# Patient Record
Sex: Male | Born: 1970 | Race: White | Hispanic: No | Marital: Single | Smoking: Former smoker
Health system: Southern US, Community
[De-identification: ages and names within clinical notes are randomized; demographics above are authoritative.]

## PROBLEM LIST (undated history)

## (undated) DIAGNOSIS — E7800 Pure hypercholesterolemia, unspecified: Secondary | ICD-10-CM

## (undated) DIAGNOSIS — E119 Type 2 diabetes mellitus without complications: Secondary | ICD-10-CM

## (undated) DIAGNOSIS — S82842A Displaced bimalleolar fracture of left lower leg, initial encounter for closed fracture: Secondary | ICD-10-CM

## (undated) DIAGNOSIS — J189 Pneumonia, unspecified organism: Secondary | ICD-10-CM

## (undated) DIAGNOSIS — I10 Essential (primary) hypertension: Secondary | ICD-10-CM

## (undated) HISTORY — PX: WISDOM TOOTH EXTRACTION: SHX21

---

## 2016-02-02 ENCOUNTER — Emergency Department (HOSPITAL_COMMUNITY): Payer: Worker's Compensation

## 2016-02-02 ENCOUNTER — Emergency Department (HOSPITAL_COMMUNITY)
Admission: EM | Admit: 2016-02-02 | Discharge: 2016-02-02 | Disposition: A | Discharge status: Home / Self Care | Payer: Worker's Compensation | Attending: Emergency Medicine | Admitting: Emergency Medicine

## 2016-02-02 ENCOUNTER — Encounter (HOSPITAL_COMMUNITY): Payer: Self-pay

## 2016-02-02 DIAGNOSIS — W1839XA Other fall on same level, initial encounter: Secondary | ICD-10-CM | POA: Insufficient documentation

## 2016-02-02 DIAGNOSIS — Z7984 Long term (current) use of oral hypoglycemic drugs: Secondary | ICD-10-CM | POA: Insufficient documentation

## 2016-02-02 DIAGNOSIS — Y999 Unspecified external cause status: Secondary | ICD-10-CM | POA: Diagnosis not present

## 2016-02-02 DIAGNOSIS — Z79899 Other long term (current) drug therapy: Secondary | ICD-10-CM | POA: Insufficient documentation

## 2016-02-02 DIAGNOSIS — E1165 Type 2 diabetes mellitus with hyperglycemia: Secondary | ICD-10-CM | POA: Insufficient documentation

## 2016-02-02 DIAGNOSIS — S82892A Other fracture of left lower leg, initial encounter for closed fracture: Secondary | ICD-10-CM

## 2016-02-02 DIAGNOSIS — Y929 Unspecified place or not applicable: Secondary | ICD-10-CM | POA: Insufficient documentation

## 2016-02-02 DIAGNOSIS — Z7982 Long term (current) use of aspirin: Secondary | ICD-10-CM | POA: Insufficient documentation

## 2016-02-02 DIAGNOSIS — S82852A Displaced trimalleolar fracture of left lower leg, initial encounter for closed fracture: Secondary | ICD-10-CM | POA: Insufficient documentation

## 2016-02-02 DIAGNOSIS — I10 Essential (primary) hypertension: Secondary | ICD-10-CM | POA: Diagnosis not present

## 2016-02-02 DIAGNOSIS — S9305XA Dislocation of left ankle joint, initial encounter: Secondary | ICD-10-CM | POA: Insufficient documentation

## 2016-02-02 DIAGNOSIS — S99912A Unspecified injury of left ankle, initial encounter: Secondary | ICD-10-CM | POA: Diagnosis present

## 2016-02-02 DIAGNOSIS — Y939 Activity, unspecified: Secondary | ICD-10-CM | POA: Diagnosis not present

## 2016-02-02 HISTORY — DX: Type 2 diabetes mellitus without complications: E11.9

## 2016-02-02 HISTORY — DX: Essential (primary) hypertension: I10

## 2016-02-02 HISTORY — DX: Pure hypercholesterolemia, unspecified: E78.00

## 2016-02-02 LAB — CBC WITH DIFFERENTIAL/PLATELET
BASOS ABS: 0 10*3/uL (ref 0.0–0.1)
BASOS PCT: 0 %
EOS ABS: 0.1 10*3/uL (ref 0.0–0.7)
Eosinophils Relative: 1 %
HEMATOCRIT: 44.3 % (ref 39.0–52.0)
HEMOGLOBIN: 14.7 g/dL (ref 13.0–17.0)
Lymphocytes Relative: 12 %
Lymphs Abs: 1.5 10*3/uL (ref 0.7–4.0)
MCH: 28.3 pg (ref 26.0–34.0)
MCHC: 33.2 g/dL (ref 30.0–36.0)
MCV: 85.4 fL (ref 78.0–100.0)
MONO ABS: 0.5 10*3/uL (ref 0.1–1.0)
Monocytes Relative: 4 %
NEUTROS ABS: 10.3 10*3/uL — AB (ref 1.7–7.7)
NEUTROS PCT: 83 %
Platelets: 285 10*3/uL (ref 150–400)
RBC: 5.19 MIL/uL (ref 4.22–5.81)
RDW: 12.6 % (ref 11.5–15.5)
WBC: 12.4 10*3/uL — ABNORMAL HIGH (ref 4.0–10.5)

## 2016-02-02 LAB — BASIC METABOLIC PANEL
ANION GAP: 9 (ref 5–15)
BUN: 14 mg/dL (ref 6–20)
CALCIUM: 10 mg/dL (ref 8.9–10.3)
CO2: 23 mmol/L (ref 22–32)
CREATININE: 1.11 mg/dL (ref 0.61–1.24)
Chloride: 103 mmol/L (ref 101–111)
Glucose, Bld: 172 mg/dL — ABNORMAL HIGH (ref 65–99)
Potassium: 4.3 mmol/L (ref 3.5–5.1)
SODIUM: 135 mmol/L (ref 135–145)

## 2016-02-02 MED ORDER — MIDAZOLAM HCL 2 MG/2ML IJ SOLN
INTRAMUSCULAR | Status: AC
  Filled 2016-02-02: qty 2

## 2016-02-02 MED ORDER — NAPROXEN 500 MG PO TABS: 500.0000 mg | ORAL_TABLET | Freq: Two times a day (BID) | ORAL | Status: AC | PRN

## 2016-02-02 MED ORDER — ETOMIDATE 2 MG/ML IV SOLN
10.0000 mg | Freq: Once | INTRAVENOUS | Status: DC
  Filled 2016-02-02: qty 10

## 2016-02-02 MED ORDER — HYDROMORPHONE HCL 1 MG/ML IJ SOLN
0.5000 mg | Freq: Once | INTRAMUSCULAR | Status: AC
  Administered 2016-02-02: 0.5 mg via INTRAVENOUS
  Filled 2016-02-02: qty 1

## 2016-02-02 MED ORDER — OXYCODONE-ACETAMINOPHEN 5-325 MG PO TABS: 1.0000 | ORAL_TABLET | Freq: Four times a day (QID) | ORAL | Status: AC | PRN

## 2016-02-02 MED ORDER — MIDAZOLAM HCL 2 MG/2ML IJ SOLN
INTRAMUSCULAR | Status: AC | PRN
  Administered 2016-02-02: 2 mg via INTRAVENOUS

## 2016-02-02 MED ORDER — MIDAZOLAM HCL 2 MG/2ML IJ SOLN: 2.0000 mg | Freq: Once | INTRAMUSCULAR | Status: DC

## 2016-02-02 MED ORDER — HYDROMORPHONE HCL 1 MG/ML IJ SOLN
0.5000 mg | Freq: Once | INTRAMUSCULAR | Status: AC
Start: 2016-02-02 — End: 2016-02-02
  Administered 2016-02-02: 0.5 mg via INTRAVENOUS
  Filled 2016-02-02: qty 1

## 2016-02-02 MED ORDER — ETOMIDATE 2 MG/ML IV SOLN
INTRAVENOUS | Status: AC | PRN
  Administered 2016-02-02: 10 mg via INTRAVENOUS

## 2016-02-02 NOTE — ED Notes (Addendum)
Pt. BIB [facility] following L lower leg injury. Pt. [facility] got stuck. Pt. Has deformity to L ankle and lower leg. Pt. Leg splinted on arrival. Pt. Given 10 mg morphine and 4 mg zofran.

## 2016-02-02 NOTE — Progress Notes (Signed)
Orthopedic Tech Progress Note Patient Details:  Lonia ChimeraMichael W Zalesky 1971/01/14 [id number]  Ortho Devices Type of Ortho Device: Ace wrap, Post (short leg) splint, Stirrup splint, Crutches Ortho Device/Splint Location: lle Ortho Device/Splint Interventions: Application   Jullian Previti 02/02/2016, 2:47 PM

## 2016-02-02 NOTE — ED Provider Notes (Signed)
CSN: [id number]     Arrival date & time 02/02/16  1100 History   First MD Initiated Contact with Patient 02/02/16 1100     Chief Complaint  Patient presents with  . Leg Injury     (Consider location/radiation/quality/duration/timing/severity/associated sxs/prior Treatment) HPI  Past Medical History  Diagnosis Date  . Hypertension   . Diabetes mellitus without complication (HCC)   . Hypercholesteremia    History reviewed. No pertinent past surgical history. No family history on file. Social History  Substance Use Topics  . Smoking status: None  . Smokeless tobacco: None  . Alcohol Use: None    Review of Systems    Allergies  Review of patient's allergies indicates no known allergies.  Home Medications   Prior to Admission medications   Medication Sig Start Date End Date Taking? Authorizing Provider  aspirin 81 MG tablet Take 81 mg by mouth daily.   Yes Historical Provider, MD  fenofibrate (TRICOR) 145 MG tablet Take 145 mg by mouth daily. 01/09/16  Yes Historical Provider, MD  ibuprofen (ADVIL,MOTRIN) 200 MG tablet Take 200 mg by mouth every 6 (six) hours as needed for fever.   Yes Historical Provider, MD  JARDIANCE 10 MG TABS tablet Take 10 mg by mouth daily. 01/26/16  Yes Historical Provider, MD  losartan (COZAAR) 25 MG tablet Take 25 mg by mouth daily. 01/13/16  Yes Historical Provider, MD  metFORMIN (GLUCOPHAGE) 500 MG tablet Take 1,000 mg by mouth 2 (two) times daily. 01/09/16  Yes Historical Provider, MD  simvastatin (ZOCOR) 40 MG tablet Take 40 mg by mouth daily. 01/09/16  Yes Historical Provider, MD  naproxen (NAPROSYN) 500 MG tablet Take 1 tablet (500 mg total) by mouth 2 (two) times daily as needed for mild pain or moderate pain (TAKE WITH MEALS.). 02/02/16   Mercedes Camprubi-Soms, PA-C  oxyCODONE-acetaminophen (PERCOCET) 5-325 MG tablet Take 1-2 tablets by mouth every 6 (six) hours as needed for severe pain. 02/02/16   Mercedes Camprubi-Soms, PA-C   BP 123/85 mmHg   Pulse 90  Temp(Src) 99 F (37.2 C) (Oral)  Resp 10  Ht 5\' 6"  (1.676 m)  SpO2 89% Physical Exam  ED Course  .Sedation Date/Time: 02/02/2016 2:22 PM Performed by: Jacalyn Lefevre Authorized by: Jacalyn Lefevre  Consent:    Consent obtained:  Written   Consent given by:  Patient and spouse   Risks discussed:  Allergic reaction, dysrhythmia, inadequate sedation, nausea, prolonged hypoxia resulting in organ damage, prolonged sedation necessitating reversal, respiratory compromise necessitating ventilatory assistance and intubation and vomiting   Alternatives discussed:  Analgesia without sedation Indications:    Sedation purpose:  Fracture reduction   Procedure necessitating sedation performed by:  Physician performing sedation   Intended level of sedation:  Moderate (conscious sedation) Pre-sedation assessment:    Time since last food or drink:  HOURS   ASA classification: class 1 - normal, healthy patient     Neck mobility: normal     Mouth opening:  3 or more finger widths   Pre-sedation assessments completed and reviewed: airway patency, cardiovascular function, hydration status, mental status, nausea/vomiting, respiratory function and temperature   Immediate pre-procedure details:    Reassessment: Patient reassessed immediately prior to procedure     Reviewed: vital signs, relevant labs/tests and NPO status     Verified: bag valve mask available, emergency equipment available, intubation equipment available, IV patency confirmed, oxygen available, reversal medications available and suction available   Procedure details (see MAR for exact dosages):  Preoxygenation:  Nasal cannula   Sedation:  Etomidate and midazolam   Analgesia:  Hydromorphone   Intra-procedure monitoring:  Blood pressure monitoring, cardiac monitor, continuous capnometry, continuous pulse oximetry, frequent LOC assessments and frequent vital sign checks   Intra-procedure events: hypoxia     Intra-procedure  management:  Airway repositioning Post-procedure details:    Attendance: Constant attendance by certified staff until patient recovered     Recovery: Patient returned to pre-procedure baseline     Post-sedation assessments completed and reviewed: airway patency, cardiovascular function, hydration status, mental status, nausea/vomiting, pain level, respiratory function and temperature     Patient is stable for discharge or admission: Yes     Patient tolerance:  Tolerated well, no immediate complications Reduction of fracture Date/Time: 02/02/2016 2:30 PM Performed by: Jacalyn LefevreHAVILAND, Aerion Bagdasarian Authorized by: Jacalyn LefevreHAVILAND, Shadae Reino Consent: Written consent obtained. Risks and benefits: risks, benefits and alternatives were discussed Consent given by: patient Patient understanding: patient states understanding of the procedure being performed Patient consent: the patient's understanding of the procedure matches consent given Procedure consent: procedure consent matches procedure scheduled Relevant documents: relevant documents present and verified Test results: test results available and properly labeled Site marked: the operative site was marked Imaging studies: imaging studies available Required items: required blood products, implants, devices, and special equipment available Patient identity confirmed: verbally with patient Time out: Immediately prior to procedure a "time out" was called to verify the correct patient, procedure, equipment, support staff and site/side marked as required. Preparation: Patient was prepped and draped in the usual sterile fashion. Local anesthesia used: no Patient sedated: yes Sedation type: moderate (conscious) sedation Sedatives: etomidate and midazolam Vitals: Vital signs were monitored during sedation. Patient tolerance: Patient tolerated the procedure well with no immediate complications   (including critical care time) Labs Review Labs Reviewed  CBC WITH  DIFFERENTIAL/PLATELET - Abnormal; Notable for the following:    WBC 12.4 (*)    Neutro Abs 10.3 (*)    All other components within normal limits  BASIC METABOLIC PANEL - Abnormal; Notable for the following:    Glucose, Bld 172 (*)    All other components within normal limits    Imaging Review Dg Tibia/fibula Left  02/02/2016  CLINICAL DATA:  Left ankle deformity after a fall during training. EXAM: LEFT TIBIA AND FIBULA - 2 VIEW COMPARISON:  None. FINDINGS: Today' s exam excludes the ankle which is radiographic independently. No knee effusion. Very early degenerative marginal spurring of the patella with low-level prepatellar edema and subtle edema in Kager's fat pad. No fracture of the proximal or mid portions of the tibia or fibula identified. The patient has known ankle fractures detailed under separate report. IMPRESSION: 1. No additional fractures of the tibia or fibula, aside from that is in the vicinity of the ankle. 2. Trace edema in Kager's fat pad with subtle prepatellar edema. Electronically Signed   By: Gaylyn RongWalter  Liebkemann M.D.   On: 02/02/2016 12:20   Dg Ankle 2 Views Left  02/02/2016  CLINICAL DATA:  Status post external reduction and splinting, fracture dislocation of the left ankle. EXAM: LEFT ANKLE - 2 VIEW COMPARISON:  02/02/2016 at 11:48 a.m. FINDINGS: Interval reduction of the prior dislocation. Fiberglass splint reduces bony detail. Oblique lateral malleolar fracture and oblique posterior malleolar fracture are again observed although low reduced. There is an unusual dorsal prominence medially at the Lisfranc joint. This may simply be projectional but I would suggest obtaining dedicated foot projections if possible to ensure lack of injury at  the Lisfranc joint. IMPRESSION: 1. Excellent reduction of the prior ankle dislocation status post external reduction and splinting. Oblique lateral malleolar and posterior malleolar fractures observed, much better approximated than before. 2.  Questionable dorsal bony irregularity medially at the Lisfranc joint. This is unusual enough appearing that I would suggest dedicated foot radiographs to assess the Lisfranc joint. Electronically Signed   By: Gaylyn Rong M.D.   On: 02/02/2016 13:41   Dg Ankle Complete Left  02/02/2016  CLINICAL DATA:  Left ankle deformity after slipping on a mat during a training exercise with [employer]. EXAM: LEFT ANKLE COMPLETE - 3+ VIEW COMPARISON:  None. FINDINGS: Prominently displaced oblique fracture of the lateral malleolus. The distal tibia is displaced about 3.5 cm head medially, as is the shaft of the distal fibula, and both appear rotated with respect to the talus. There is a bony fragment just proximal to the talus probably representing a posterior malleolar fragment. Classic appearance of posterior malleolar fracture is difficult to ascertain on the lateral projection due to the degree of rotation of the tibia. The abnormal appearance on today' s exam implies a tear of the tibiotalar portion of the deltoid ligament. Because of orientation on the lateral projection, the subtalar joint and calcaneocuboid joint are difficult to assess. IMPRESSION: 1. Trimalleolar fracture dislocation, including oblique lateral malleolar fracture, posterior malleolar fracture, and with prominent medial dislocation of the distal tibia and the fibular shaft fracture component with respect to the talus implying a concomitant tear of the deltoid ligament. In addition there is a somewhat unusual degree of rotation of the distal tibia and fibular shaft with respect to the talus, with the medial malleolus rotated to a posterior position with respect to the talus. Electronically Signed   By: Gaylyn Rong M.D.   On: 02/02/2016 12:18   I have personally reviewed and evaluated these images and lab results as part of my medical decision-making.   EKG Interpretation None      MDM   Final diagnoses:  Closed left  ankle fracture, initial encounter  Ankle dislocation, left, initial encounter  Type 2 diabetes mellitus with hyperglycemia, without long-term current use of insulin (HCC)  Essential hypertension      Jacalyn Lefevre, MD 02/02/16 1458

## 2016-02-02 NOTE — Discharge Instructions (Signed)
Wear ankle splint at all times. Use crutches for all weight bearing activities. Use ice and elevate ankle throughout the day, keeping it propped up above the level of your heart at all times aside from when you're eating or using the restroom, and use ice pack for 20 minutes every hour. Alternate between naprosyn and percocet for pain relief. Do not drive or operate machinery with pain medication use. Call orthopedic surgeon's office (Dr. Roda ShuttersXu) today  to schedule followup appointment tomorrow for ongoing management of your ankle fracture. Dr. Roda ShuttersXu was called today and knows you will be contacting his office for further management of your ankle fracture. Return to the ER for changes or worsening symptoms.    Tibial and Fibular Fracture, Adult Tibial and fibular fracture is a break in the bones of your lower leg (tibia and fibula). The tibia is the larger of these two bones. The fibula is the smaller of the two bones. It is on the outer side of your leg.  CAUSES  Low-energy injuries, such as a fall from ground level.  High-energy injuries, such as motor vehicle injuries, gunshot wounds, or high-speed sports collisions. RISK FACTORS  Jumping activities.  Repetitive stress, such as long-distance running.  Participation in sports.  Osteoporosis.  Advanced age. SIGNS AND SYMPTOMS  Pain.  Swelling.  Inability to put weight on your injured leg.  Bone deformities at the site of your injury.  Bruising. DIAGNOSIS  Tibial and fibular fractures are diagnosed with the use of X-ray exams. TREATMENT  If you have a simple fracture of these two bones, they can be treated with simple immobilization. A cast or splint will be used on your leg to keep it from moving while it heals. Then you can begin range-of-motion exercises to regain your knee motion. HOME CARE INSTRUCTIONS   Apply ice to your leg:  Put ice in a plastic bag.  Place a towel between your skin and the bag.  Leave the ice on for 20  minutes, 2-3 times a day.  If you have a plaster or fiberglass cast:  Do not try to scratch the skin under the cast using sharp or pointed objects.  Check the skin around the cast every day. You may put lotion on any red or sore areas.  Keep your cast dry and clean.  If you have a plaster splint:  Wear the splint as directed.  You may loosen the elastic around the splint if your toes become numb, tingle, or turn cold or blue.  Do not put pressure on any part of your cast or splint until it is fully hardened, because it may deform.  Your cast or splint can be protected during bathing with a plastic bag. Do not lower the cast or splint into water.  Use crutches as directed.  Only take over-the-counter or prescription medicines for pain, discomfort, or fever as directed by your health care provider.  Follow all instructions given to you by your health care provider.  Make and keep all follow-up appointments. SEEK MEDICAL CARE IF:  Your pain is becoming worse rather than better or is not controlled with medicines.  You have increased swelling or redness in the foot.  You begin to lose feeling in your foot or toes. SEEK IMMEDIATE MEDICAL CARE IF:  You develop a cold or blue foot or toes on the injured side.  You develop severe pain in your injured leg, especially if the pain is increased with movement of your toes. MAKE SURE  YOU:  Understand these instructions.  Will watch your condition.  Will get help right away if you are not doing well or get worse.   This information is not intended to replace advice given to you by your health care provider. Make sure you discuss any questions you have with your health care provider.   Document Released: 04/24/2002 Document Revised: 12/17/2014 Document Reviewed: 03/14/2013 Elsevier Interactive Patient Education 2016 [employer]  Cast or Splint Care Casts and splints support injured limbs and keep bones from moving while they  heal.  HOME CARE  Keep the cast or splint uncovered during the drying period.  A plaster cast can take 24 to 48 hours to dry.  A fiberglass cast will dry in less than 1 hour.  Do not rest the cast on anything harder than a pillow for 24 hours.  Do not put weight on your injured limb. Do not put pressure on the cast. Wait for your doctor's approval.  Keep the cast or splint dry.  Cover the cast or splint with a plastic bag during baths or wet weather.  If you have a cast over your chest and belly (trunk), take sponge baths until the cast is taken off.  If your cast gets wet, dry it with a towel or blow dryer. Use the cool setting on the blow dryer.  Keep your cast or splint clean. Wash a dirty cast with a damp cloth.  Do not put any objects under your cast or splint.  Do not scratch the skin under the cast with an object. If itching is a problem, use a blow dryer on a cool setting over the itchy area.  Do not trim or cut your cast.  Do not take out the padding from inside your cast.  Exercise your joints near the cast as told by your doctor.  Raise (elevate) your injured limb on 1 or 2 pillows for the first 1 to 3 days. GET HELP IF:  Your cast or splint cracks.  Your cast or splint is too tight or too loose.  You itch badly under the cast.  Your cast gets wet or has a soft spot.  You have a bad smell coming from the cast.  You get an object stuck under the cast.  Your skin around the cast becomes red or sore.  You have new or more pain after the cast is put on. GET HELP RIGHT AWAY IF:  You have fluid leaking through the cast.  You cannot move your fingers or toes.  Your fingers or toes turn blue or white or are cool, painful, or puffy (swollen).  You have tingling or lose feeling (numbness) around the injured area.  You have bad pain or pressure under the cast.  You have trouble breathing or have shortness of breath.  You have chest pain.   This  information is not intended to replace advice given to you by your health care provider. Make sure you discuss any questions you have with your health care provider.   Document Released: 12/02/2010 Document Revised: 04/04/2013 Document Reviewed: 02/08/2013 Elsevier Interactive Patient Education 2016 [employer]  Ankle Dislocation Ankle dislocation happens when the ankle bones move out of place. Usually, the injury that causes ankle dislocation also causes other injuries that are more serious. Often these injuries are broken ankle bones. You also may have injured nerves and blood vessels.  Your doctor will put your ankle back in place. Any tears on your skin around  your ankle will be closed (this is usually done in surgery). A cast or splint will be placed around your ankle to hold it in place while it heals. Sometimes, screws and plates need to be drilled into your ankle bones to hold your ankle in place. Sometimes, pins are drilled into the bones of your lower leg and foot. These pins attach to a metal bar outside your body. The pins and the bar hold your bones in place until surgery can be done. HOME CARE  Rest your injured joint. Do not move it.  Put ice on your injured joint for 1 to 2 days or as told by your doctor.  Put ice in a plastic bag.  Place a towel between your skin and the bag.  Leave the ice on for 15 to 20 minutes, every 2 hours while your are awake.  Raise your ankle above your heart as told by your doctor. This helps limit puffiness.  Move your toes as told by your doctor so they do not get stiff.  Only take medicines as told by your doctor. GET HELP RIGHT AWAY IF:  Your cast or splint becomes loose or damaged.  Your screws, plates, or the metal bar outside your body becomes loose or damaged.  You notice fluid draining around any pins.  Your pain becomes worse, not better.  You lose feeling in your toe or cannot bend the tip of your toe. MAKE SURE YOU:    Understand these instructions.  Will watch your condition.  Will get help right away if you are not doing well or get worse.   This information is not intended to replace advice given to you by your health care provider. Make sure you discuss any questions you have with your health care provider.   Document Released: 03/31/2011 Document Revised: 08/23/2014 Document Reviewed: 03/04/2015 Elsevier Interactive Patient Education [employer]

## 2016-02-02 NOTE — ED Provider Notes (Signed)
CSN: [id number]     Arrival date & time 02/02/16  1100 History   First MD Initiated Contact with Patient 02/02/16 1100     Chief Complaint  Patient presents with  . Leg Injury     (Consider location/radiation/quality/duration/timing/severity/associated sxs/prior Treatment) HPI Comments: HADYN BLANCK is a 45 y.o. male with a PMHx of HTN, HLD, DM2, and chronic back pain, who presents to the ED with complaints of left ankle injury prior to arrival. Patient states that he was in self-defense training, had his left foot planted and he got stuck underneath the mat as his body was pulled down and twisted sideways during a training maneuver. This occurred around 9-10 AM approximately 1 hour prior to arrival. He has an obvious deformity to the left ankle with decreased range of motion in the left ankle, and complains of 5/10 constant throbbing/aching nonradiating left ankle pain, worse with movement, and improved with 10 mg of morphine and immobilization performed by EMS. He last ate around 8 AM when he had a yogurt, fruits medley, and egg sandwich. He has no known allergies to any medications. He has not previously injured this ankle, nor does he have a orthopedic surgeon. He previously had back pain for which she has been seen by the spine specialists across the [place] [place], cannot recall their name, but denies that this was an [employer], operational.  He denies any head injury, loss of consciousness, neck or back pain, knee or thigh pain, other injuries, skin injury to the left ankle, bruising, swelling, chest pain, shortness breath, abdominal pain, nausea, vomiting, urinary symptoms, numbness, tingling, or focal weakness. He is able to wiggle his toes.  Patient is a 45 y.o. male presenting with ankle pain. The history is provided by the patient and the EMS personnel. No language interpreter was used.  Ankle Pain Location:  Ankle Time since incident:  1 hour Injury: yes   Mechanism of injury: fall    Mechanism of injury comment:  L foot planted on mat, body taken down during self-defence training Ankle location:  L ankle Pain details:    Quality:  Aching and throbbing   Radiates to:  Does not radiate   Severity:  Moderate   Onset quality:  Sudden   Duration:  1 hour   Timing:  Constant   Progression:  Unchanged Chronicity:  New Dislocation: yes   Foreign body present:  No foreign bodies Prior injury to area:  No Relieved by:  Immobilization (and morphine 10mg ) Worsened by:  Activity Ineffective treatments:  None tried Associated symptoms: decreased ROM   Associated symptoms: no back pain, no muscle weakness, no neck pain, no numbness, no swelling and no tingling     Past Medical History  Diagnosis Date  . Hypertension   . Diabetes mellitus without complication (HCC)   . Hypercholesteremia    History reviewed. No pertinent past surgical history. No family history on file. Social History  Substance Use Topics  . Smoking status: None  . Smokeless tobacco: None  . Alcohol Use: None    Review of Systems  HENT: Negative for facial swelling (no head inj).   Respiratory: Negative for shortness of breath.   Cardiovascular: Negative for chest pain.  Gastrointestinal: Negative for nausea, vomiting and abdominal pain.  Genitourinary: Negative for dysuria and hematuria.  Musculoskeletal: Positive for arthralgias. Negative for myalgias, back pain, joint swelling and neck pain.  Skin: Negative for color change and wound.  Allergic/Immunologic: Positive for immunocompromised state (diabetic).  Neurological:  Negative for syncope, weakness and numbness.  Psychiatric/Behavioral: Negative for confusion.   10 Systems reviewed and are negative for acute change except as noted in the HPI.    Allergies  Review of patient's allergies indicates not on file.  Home Medications   Prior to Admission medications   Medication Sig Start Date End Date Taking? Authorizing Provider   aspirin 81 MG tablet Take 81 mg by mouth daily.   Yes Historical Provider, MD  fenofibrate (TRICOR) 145 MG tablet Take 145 mg by mouth daily. 01/09/16  Yes Historical Provider, MD  ibuprofen (ADVIL,MOTRIN) 200 MG tablet Take 200 mg by mouth every 6 (six) hours as needed for fever.   Yes Historical Provider, MD  JARDIANCE 10 MG TABS tablet Take 10 mg by mouth daily. 01/26/16  Yes Historical Provider, MD  losartan (COZAAR) 25 MG tablet Take 25 mg by mouth daily. 01/13/16  Yes Historical Provider, MD  metFORMIN (GLUCOPHAGE) 500 MG tablet Take 1,000 mg by mouth 2 (two) times daily. 01/09/16  Yes Historical Provider, MD  simvastatin (ZOCOR) 40 MG tablet Take 40 mg by mouth daily. 01/09/16  Yes Historical Provider, MD   BP 159/124 mmHg  Pulse 77  Temp(Src) 99 F (37.2 C) (Oral)  Resp 22  SpO2 100% Recheck VS: BP 129/95 mmHg  Pulse 75  Temp(Src) 99 F (37.2 C) (Oral)  Resp 20  SpO2 98%  Physical Exam  Constitutional: He is oriented to person, place, and time. Vital signs are normal. He appears well-developed and well-nourished.  Non-toxic appearance. No distress.  Afebrile, nontoxic, NAD, HTN noted which is likely due to pain/stress response  HENT:  Head: Normocephalic and atraumatic.  Mouth/Throat: Oropharynx is clear and moist and mucous membranes are normal.  Eyes: Conjunctivae and EOM are normal. Right eye exhibits no discharge. Left eye exhibits no discharge.  Neck: Normal range of motion. Neck supple.  Cardiovascular: Normal rate, regular rhythm, normal heart sounds and intact distal pulses.  Exam reveals no gallop and no friction rub.   No murmur heard. Pulmonary/Chest: Effort normal and breath sounds normal. No respiratory distress. He has no decreased breath sounds. He has no wheezes. He has no rhonchi. He has no rales.  Abdominal: Soft. Normal appearance and bowel sounds are normal. He exhibits no distension. There is no tenderness. There is no rigidity, no rebound and no guarding.   Musculoskeletal:       Left knee: He exhibits normal range of motion, no swelling, no deformity, normal alignment, no LCL laxity and no MCL laxity. No tenderness found.       Left ankle: He exhibits decreased range of motion (in splint) and deformity. He exhibits no swelling, no ecchymosis, no laceration and normal pulse. Tenderness.  L knee without TTP or bruising/swelling, no deformity, FROM intact.  LLE splinted. No tib/fib tenderness or bruising/abrasions. L ankle with obvious deformity, L foot externally rotated/deviated with skin tenting medially, focally tender over the L ankle deformity, no other tenderness to calf or foot, wiggles all digits with ease, distal pulses and cap refill brisk and present, sensation grossly intact in all extremities. No bruising or swelling noted, skin intact. Achilles not able to be assessed due to splint on leg.  All other extremities without evidence of injury, no focal areas of tenderness to remaining extremities or back.  Neurological: He is alert and oriented to person, place, and time. He has normal strength. No sensory deficit.  Skin: Skin is warm, dry and intact. No rash noted.  Psychiatric: He has a normal mood and affect.  Nursing note and vitals reviewed.   ED Course  Procedures (including critical care time)  SPLINT APPLICATION Date/Time: 1:23 PM Authorized by: Ramond Marrow Consent: Verbal consent obtained. Risks and benefits: risks, benefits and alternatives were discussed Consent given by: patient Splint applied by: orthopedic technician and myself Location details: L ankle Splint type: posterior short leg and sugar tong Supplies used: orthoglass Post-procedure: The splinted body part was neurovascularly unchanged following the procedure. Patient tolerance: Patient tolerated the procedure well with no immediate complications.    Labs Review Labs Reviewed  CBC WITH DIFFERENTIAL/PLATELET - Abnormal; Notable for the  following:    WBC 12.4 (*)    Neutro Abs 10.3 (*)    All other components within normal limits  BASIC METABOLIC PANEL - Abnormal; Notable for the following:    Glucose, Bld 172 (*)    All other components within normal limits    Imaging Review Dg Tibia/fibula Left  02/02/2016  CLINICAL DATA:  Left ankle deformity after a fall during training. EXAM: LEFT TIBIA AND FIBULA - 2 VIEW COMPARISON:  None. FINDINGS: Today' s exam excludes the ankle which is radiographic independently. No knee effusion. Very early degenerative marginal spurring of the patella with low-level prepatellar edema and subtle edema in Kager's fat pad. No fracture of the proximal or mid portions of the tibia or fibula identified. The patient has known ankle fractures detailed under separate report. IMPRESSION: 1. No additional fractures of the tibia or fibula, aside from that is in the vicinity of the ankle. 2. Trace edema in Kager's fat pad with subtle prepatellar edema. Electronically Signed   By: Gaylyn Rong M.D.   On: 02/02/2016 12:20   Dg Ankle 2 Views Left  02/02/2016  CLINICAL DATA:  Status post external reduction and splinting, fracture dislocation of the left ankle. EXAM: LEFT ANKLE - 2 VIEW COMPARISON:  02/02/2016 at 11:48 a.m. FINDINGS: Interval reduction of the prior dislocation. Fiberglass splint reduces bony detail. Oblique lateral malleolar fracture and oblique posterior malleolar fracture are again observed although low reduced. There is an unusual dorsal prominence medially at the Lisfranc joint. This may simply be projectional but I would suggest obtaining dedicated foot projections if possible to ensure lack of injury at the Lisfranc joint. IMPRESSION: 1. Excellent reduction of the prior ankle dislocation status post external reduction and splinting. Oblique lateral malleolar and posterior malleolar fractures observed, much better approximated than before. 2. Questionable dorsal bony irregularity medially at  the Lisfranc joint. This is unusual enough appearing that I would suggest dedicated foot radiographs to assess the Lisfranc joint. Electronically Signed   By: Gaylyn Rong M.D.   On: 02/02/2016 13:41   Dg Ankle Complete Left  02/02/2016  CLINICAL DATA:  Left ankle deformity after slipping on a mat during a training exercise with the [employer]. EXAM: LEFT ANKLE COMPLETE - 3+ VIEW COMPARISON:  None. FINDINGS: Prominently displaced oblique fracture of the lateral malleolus. The distal tibia is displaced about 3.5 cm head medially, as is the shaft of the distal fibula, and both appear rotated with respect to the talus. There is a bony fragment just proximal to the talus probably representing a posterior malleolar fragment. Classic appearance of posterior malleolar fracture is difficult to ascertain on the lateral projection due to the degree of rotation of the tibia. The abnormal appearance on today' s exam implies a tear of the tibiotalar portion of the deltoid ligament. Because of orientation  on the lateral projection, the subtalar joint and calcaneocuboid joint are difficult to assess. IMPRESSION: 1. Trimalleolar fracture dislocation, including oblique lateral malleolar fracture, posterior malleolar fracture, and with prominent medial dislocation of the distal tibia and the fibular shaft fracture component with respect to the talus implying a concomitant tear of the deltoid ligament. In addition there is a somewhat unusual degree of rotation of the distal tibia and fibular shaft with respect to the talus, with the medial malleolus rotated to a posterior position with respect to the talus. Electronically Signed   By: Gaylyn RongWalter  Liebkemann M.D.   On: 02/02/2016 12:18   I have personally reviewed and evaluated these images and lab results as part of my medical decision-making.   EKG Interpretation None      MDM   Final diagnoses:  Closed left ankle fracture, initial encounter  Ankle  dislocation, left, initial encounter  Type 2 diabetes mellitus with hyperglycemia, without long-term current use of insulin (HCC)  Essential hypertension    45 y.o. male here with L ankle pain/deformity after injury during which his foot was planted and his body was taken down to the ground (self defense class). +Pulses intact, wiggles digits without difficulty, obvious deformity with foot rotated externally, tenderness to ankle joint region, no tib/fib or foot tenderness, no bruising noted. Sensation grossly intact. Cap refill brisk and present. Pt got morphine en route, declines any further medications at this time, advised him to let us know if he changes his mind on this. No ortho surgeon previously. NPO since 8am. HTN noted, but likely related to pain/stress response. No other injuries noted, no other evidence of trauma to remainder of body. Will obtain xray of L tib/fib/ankle. Will get basic labs in case pt needs operative management of this injury. Will reassess shortly. Discussed case with my attending Dr. Particia NearingHaviland who agrees with plan.   11:33 AM Dr. Particia NearingHaviland in to see pt, he is now requesting pain meds, will give Dilaudid now. Also wants procedural sedation orders to be started so we can realign the ankle, she will order the medications, will get nursing staff on board. Awaiting labs/imaging. Will reassess shortly.   12:30 PM CBC w/diff with mild leukocytosis likely from stress demargination; otherwise WNL. BMP with mildly elevated gluc 172, otherwise WNL. Xray showing trimalleolar fx with dislocation including oblique lateral malleolar fx and posterior malleolar fx with prominent medial dislocation of the distal tibia and fibular shaft fx component with respect to talus, likely concomitant deltoid tear. Tib/fib xray neg. Will proceed with reduction with procedural sedation done by Dr. Particia NearingHaviland. Pt updated and in agreement with plan. Still in pain, will give more dilaudid now. Will get ortho  tech with splinting supplies ready at bed side. Discussed that he'll likely need surgical intervention at a later date to fix the fractures, but could likely schedule this as an outpatient as long as we are able to improve alignment here today. Will reassess after reduction  1:21 PM Procedure went well, pt tolerating well, reduction of ankle dislocation performed by Dr. Particia NearingHaviland with my assistance in splinting, ankle with improved alignment visually, will get post-reduction xrays done now. Will touch base with ortho to discuss case so he can f/up closely with them for likely surgical intervention of this injury. Pt stable at this time  1:33 PM  Post-reduction films seen while they were being taken, alignment improved, dislocation appears reduced, awaiting official reading. Discussed case with ortho, Dr. Roda ShuttersXu requests that we obtain CT ankle  fx today and d/c home with instructions to keep ankle elevated and f/up in the office tomorrow, call for appt. Will obtain CT and d/c as soon as this is performed. Pt declines any concerns at this time, denies needing more pain medication. Will await CT, then d/c home with rx for percocet/naprosyn, crutches given. RICE discussed. F/up with ortho for ongoing management of fx.   1:51 PM Post-reduction xray with excellent reduction, better approximation of fractures. There is a dorsal irregularity medially at the Lisfranc joint, recommending dedicated foot imaging. Discussed with radiology tech, asked that they please include this area on the CT of his ankle that way we can avoid needing further imaging aside from the CT. They will include this in the CT-- awaiting this to be done, then can get him discharged. Pt stable  2:48 PM Pt with slightly increased pain during ambulation trial with crutches, will give one last dose of pain meds prior to d/c. Has appt with Dr. Roda Shutters tomorrow. D/c home with previously discussed plan. I explained the diagnosis and have given explicit  precautions to return to the ER including for any other new or worsening symptoms. The patient understands and accepts the medical plan as it's been dictated and I have answered their questions. Discharge instructions concerning home care and prescriptions have been given. The patient is STABLE and is discharged to home in good condition.   BP 113/73 mmHg  Pulse 76  Temp(Src) 99 F (37.2 C) (Oral)  Resp 12  Ht 5\' 6"  (1.676 m)  SpO2 98%  Meds ordered this encounter  Medications  . HYDROmorphone (DILAUDID) injection 0.5 mg    Sig:   . etomidate (AMIDATE) injection 10 mg    Sig:   . midazolam (VERSED) injection 2 mg    Sig:   . midazolam (VERSED) 2 MG/2ML injection    Sig:     Grace Bushy   : cabinet override  . HYDROmorphone (DILAUDID) injection 0.5 mg    Sig:   . oxyCODONE-acetaminophen (PERCOCET) 5-325 MG tablet    Sig: Take 1-2 tablets by mouth every 6 (six) hours as needed for severe pain.    Dispense:  30 tablet    Refill:  0    Order Specific Question:  Supervising Provider    Answer:  MILLER, BRIAN [[id number]]  . naproxen (NAPROSYN) 500 MG tablet    Sig: Take 1 tablet (500 mg total) by mouth 2 (two) times daily as needed for mild pain or moderate pain (TAKE WITH MEALS.).    Dispense:  60 tablet    Refill:  0    Order Specific Question:  Supervising Provider    Answer:  MILLER, BRIAN [[id number]]  . etomidate (AMIDATE) injection    Sig:   . midazolam (VERSED) injection    Sig:   . HYDROmorphone (DILAUDID) injection 0.5 mg    Sig:      Karelyn Brisby Camprubi-Soms, PA-C 02/02/16 1449  Jacalyn Lefevre, MD 02/02/16 1504

## 2016-02-03 ENCOUNTER — Encounter (HOSPITAL_BASED_OUTPATIENT_CLINIC_OR_DEPARTMENT_OTHER): Payer: Self-pay | Admitting: *Deleted

## 2016-02-03 ENCOUNTER — Other Ambulatory Visit: Payer: Self-pay | Admitting: Orthopaedic Surgery

## 2016-02-04 ENCOUNTER — Ambulatory Visit (HOSPITAL_BASED_OUTPATIENT_CLINIC_OR_DEPARTMENT_OTHER)
Admission: RE | Admit: 2016-02-04 | Discharge: 2016-02-04 | Disposition: A | Discharge status: Home / Self Care | Payer: Worker's Compensation | Source: Ambulatory Visit | Attending: Orthopaedic Surgery | Admitting: Orthopaedic Surgery

## 2016-02-04 ENCOUNTER — Encounter (HOSPITAL_BASED_OUTPATIENT_CLINIC_OR_DEPARTMENT_OTHER)
Admission: RE | Disposition: A | Discharge status: Home / Self Care | Payer: Self-pay | Source: Ambulatory Visit | Attending: Orthopaedic Surgery

## 2016-02-04 ENCOUNTER — Ambulatory Visit (HOSPITAL_BASED_OUTPATIENT_CLINIC_OR_DEPARTMENT_OTHER): Payer: Worker's Compensation | Admitting: Anesthesiology

## 2016-02-04 ENCOUNTER — Encounter (HOSPITAL_BASED_OUTPATIENT_CLINIC_OR_DEPARTMENT_OTHER): Payer: Self-pay | Admitting: Anesthesiology

## 2016-02-04 ENCOUNTER — Ambulatory Visit (HOSPITAL_COMMUNITY): Payer: Worker's Compensation

## 2016-02-04 DIAGNOSIS — E119 Type 2 diabetes mellitus without complications: Secondary | ICD-10-CM | POA: Diagnosis not present

## 2016-02-04 DIAGNOSIS — Z79899 Other long term (current) drug therapy: Secondary | ICD-10-CM | POA: Insufficient documentation

## 2016-02-04 DIAGNOSIS — E7800 Pure hypercholesterolemia, unspecified: Secondary | ICD-10-CM | POA: Diagnosis not present

## 2016-02-04 DIAGNOSIS — I10 Essential (primary) hypertension: Secondary | ICD-10-CM | POA: Insufficient documentation

## 2016-02-04 DIAGNOSIS — S82842A Displaced bimalleolar fracture of left lower leg, initial encounter for closed fracture: Secondary | ICD-10-CM | POA: Insufficient documentation

## 2016-02-04 DIAGNOSIS — X58XXXA Exposure to other specified factors, initial encounter: Secondary | ICD-10-CM | POA: Insufficient documentation

## 2016-02-04 DIAGNOSIS — Z87891 Personal history of nicotine dependence: Secondary | ICD-10-CM | POA: Insufficient documentation

## 2016-02-04 DIAGNOSIS — Z7984 Long term (current) use of oral hypoglycemic drugs: Secondary | ICD-10-CM | POA: Diagnosis not present

## 2016-02-04 DIAGNOSIS — T148XXA Other injury of unspecified body region, initial encounter: Secondary | ICD-10-CM

## 2016-02-04 HISTORY — DX: Displaced bimalleolar fracture of left lower leg, initial encounter for closed fracture: S82.842A

## 2016-02-04 HISTORY — PX: ORIF ANKLE FRACTURE: SHX5408

## 2016-02-04 HISTORY — DX: Pneumonia, unspecified organism: J18.9

## 2016-02-04 LAB — GLUCOSE, CAPILLARY
Glucose-Capillary: 179 mg/dL — ABNORMAL HIGH (ref 65–99)
Glucose-Capillary: 196 mg/dL — ABNORMAL HIGH (ref 65–99)

## 2016-02-04 SURGERY — OPEN REDUCTION INTERNAL FIXATION (ORIF) ANKLE FRACTURE
Anesthesia: General | Site: Ankle | Laterality: Left

## 2016-02-04 MED ORDER — GLYCOPYRROLATE 0.2 MG/ML IJ SOLN: 0.2000 mg | Freq: Once | INTRAMUSCULAR | Status: DC | PRN

## 2016-02-04 MED ORDER — MIDAZOLAM HCL 2 MG/2ML IJ SOLN
INTRAMUSCULAR | Status: AC
  Filled 2016-02-04: qty 2

## 2016-02-04 MED ORDER — SENNOSIDES-DOCUSATE SODIUM 8.6-50 MG PO TABS: 1.0000 | ORAL_TABLET | Freq: Every evening | ORAL | Status: AC | PRN

## 2016-02-04 MED ORDER — MIDAZOLAM HCL 5 MG/5ML IJ SOLN
INTRAMUSCULAR | Status: DC | PRN
  Administered 2016-02-04: 2 mg via INTRAVENOUS

## 2016-02-04 MED ORDER — LACTATED RINGERS IV SOLN
INTRAVENOUS | Status: DC
  Administered 2016-02-04 (×2): via INTRAVENOUS

## 2016-02-04 MED ORDER — METHOCARBAMOL 750 MG PO TABS: 750.0000 mg | ORAL_TABLET | Freq: Two times a day (BID) | ORAL | Status: AC | PRN

## 2016-02-04 MED ORDER — SCOPOLAMINE 1 MG/3DAYS TD PT72
1.0000 | MEDICATED_PATCH | Freq: Once | TRANSDERMAL | Status: DC | PRN
Start: 2016-02-04 — End: 2016-02-04

## 2016-02-04 MED ORDER — ONDANSETRON HCL 4 MG/2ML IJ SOLN
INTRAMUSCULAR | Status: DC | PRN
  Administered 2016-02-04: 4 mg via INTRAVENOUS

## 2016-02-04 MED ORDER — PROPOFOL 10 MG/ML IV BOLUS
INTRAVENOUS | Status: DC | PRN
  Administered 2016-02-04: 300 mg via INTRAVENOUS

## 2016-02-04 MED ORDER — OXYCODONE HCL 5 MG/5ML PO SOLN: 5.0000 mg | Freq: Once | ORAL | Status: DC | PRN

## 2016-02-04 MED ORDER — HYDROMORPHONE HCL 1 MG/ML IJ SOLN
INTRAMUSCULAR | Status: AC
  Filled 2016-02-04: qty 1

## 2016-02-04 MED ORDER — ONDANSETRON HCL 4 MG PO TABS: 4.0000 mg | ORAL_TABLET | Freq: Three times a day (TID) | ORAL | Status: AC | PRN

## 2016-02-04 MED ORDER — CEFAZOLIN SODIUM-DEXTROSE 2-4 GM/100ML-% IV SOLN
INTRAVENOUS | Status: AC
  Filled 2016-02-04: qty 100

## 2016-02-04 MED ORDER — DEXAMETHASONE SODIUM PHOSPHATE 4 MG/ML IJ SOLN
INTRAMUSCULAR | Status: DC | PRN
  Administered 2016-02-04: 10 mg via INTRAVENOUS

## 2016-02-04 MED ORDER — KETOROLAC TROMETHAMINE 30 MG/ML IJ SOLN
INTRAMUSCULAR | Status: DC | PRN
  Administered 2016-02-04: 30 mg via INTRAVENOUS

## 2016-02-04 MED ORDER — FENTANYL CITRATE (PF) 100 MCG/2ML IJ SOLN
INTRAMUSCULAR | Status: AC
  Filled 2016-02-04: qty 2

## 2016-02-04 MED ORDER — LIDOCAINE HCL (CARDIAC) 20 MG/ML IV SOLN
INTRAVENOUS | Status: DC | PRN
  Administered 2016-02-04: 30 mg via INTRAVENOUS

## 2016-02-04 MED ORDER — FENTANYL CITRATE (PF) 100 MCG/2ML IJ SOLN
INTRAMUSCULAR | Status: DC | PRN
  Administered 2016-02-04 (×2): 100 ug via INTRAVENOUS

## 2016-02-04 MED ORDER — FENTANYL CITRATE (PF) 100 MCG/2ML IJ SOLN
50.0000 ug | INTRAMUSCULAR | Status: DC | PRN
  Administered 2016-02-04: 100 ug via INTRAVENOUS

## 2016-02-04 MED ORDER — OXYCODONE HCL 5 MG PO TABS: 5.0000 mg | ORAL_TABLET | Freq: Once | ORAL | Status: DC | PRN

## 2016-02-04 MED ORDER — MEPERIDINE HCL 25 MG/ML IJ SOLN: 6.2500 mg | INTRAMUSCULAR | Status: DC | PRN

## 2016-02-04 MED ORDER — ROPIVACAINE HCL 5 MG/ML IJ SOLN
INTRAMUSCULAR | Status: DC | PRN
  Administered 2016-02-04 (×2): 10 mL via PERINEURAL

## 2016-02-04 MED ORDER — HYDROMORPHONE HCL 1 MG/ML IJ SOLN
0.2500 mg | INTRAMUSCULAR | Status: DC | PRN
  Administered 2016-02-04 (×4): 0.5 mg via INTRAVENOUS

## 2016-02-04 MED ORDER — OXYCODONE HCL 5 MG PO TABS: 5.0000 mg | ORAL_TABLET | ORAL | Status: AC | PRN

## 2016-02-04 MED ORDER — DEXAMETHASONE SODIUM PHOSPHATE 10 MG/ML IJ SOLN
INTRAMUSCULAR | Status: AC
  Filled 2016-02-04: qty 1

## 2016-02-04 MED ORDER — PROPOFOL 500 MG/50ML IV EMUL
INTRAVENOUS | Status: AC
  Filled 2016-02-04: qty 50

## 2016-02-04 MED ORDER — BUPIVACAINE-EPINEPHRINE (PF) 0.5% -1:200000 IJ SOLN
INTRAMUSCULAR | Status: DC | PRN
  Administered 2016-02-04: 10 mL via PERINEURAL
  Administered 2016-02-04: 10 mL

## 2016-02-04 MED ORDER — ASPIRIN EC 325 MG PO TBEC: 325.0000 mg | DELAYED_RELEASE_TABLET | Freq: Two times a day (BID) | ORAL | Status: AC

## 2016-02-04 MED ORDER — KETOROLAC TROMETHAMINE 30 MG/ML IJ SOLN
INTRAMUSCULAR | Status: AC
  Filled 2016-02-04: qty 1

## 2016-02-04 MED ORDER — CEFAZOLIN SODIUM-DEXTROSE 2-4 GM/100ML-% IV SOLN
2.0000 g | INTRAVENOUS | Status: AC
Start: 2016-02-04 — End: 2016-02-04
  Administered 2016-02-04: 2 g via INTRAVENOUS

## 2016-02-04 MED ORDER — SODIUM CHLORIDE 0.9 % IR SOLN
Status: DC | PRN
  Administered 2016-02-04: 3000 mL

## 2016-02-04 MED ORDER — ONDANSETRON HCL 4 MG/2ML IJ SOLN
INTRAMUSCULAR | Status: AC
  Filled 2016-02-04: qty 2

## 2016-02-04 MED ORDER — OXYCODONE HCL ER 10 MG PO T12A: 10.0000 mg | EXTENDED_RELEASE_TABLET | Freq: Two times a day (BID) | ORAL | Status: AC

## 2016-02-04 MED ORDER — MIDAZOLAM HCL 2 MG/2ML IJ SOLN
1.0000 mg | INTRAMUSCULAR | Status: DC | PRN
  Administered 2016-02-04: 2 mg via INTRAVENOUS

## 2016-02-04 MED ORDER — LIDOCAINE 2% (20 MG/ML) 5 ML SYRINGE
INTRAMUSCULAR | Status: AC
  Filled 2016-02-04: qty 5

## 2016-02-04 SURGICAL SUPPLY — 76 items
BANDAGE ACE 4X5 VEL STRL LF (GAUZE/BANDAGES/DRESSINGS) IMPLANT
BANDAGE ACE 6X5 VEL STRL LF (GAUZE/BANDAGES/DRESSINGS) ×3 IMPLANT
BANDAGE ESMARK 6X9 LF (GAUZE/BANDAGES/DRESSINGS) ×1 IMPLANT
BIT DRILL 3.5X122MM AO FIT (BIT) ×3 IMPLANT
BLADE HEX COATED 2.75 (ELECTRODE) ×3 IMPLANT
BLADE SURG 15 STRL LF DISP TIS (BLADE) ×2 IMPLANT
BLADE SURG 15 STRL SS (BLADE) ×4
BNDG COHESIVE 6X5 TAN STRL LF (GAUZE/BANDAGES/DRESSINGS) ×3 IMPLANT
BNDG ESMARK 6X9 LF (GAUZE/BANDAGES/DRESSINGS) ×3
CANISTER SUCT 1200ML W/VALVE (MISCELLANEOUS) ×3 IMPLANT
COVER BACK TABLE 60X90IN (DRAPES) ×3 IMPLANT
CUFF TOURNIQUET SINGLE 34IN LL (TOURNIQUET CUFF) IMPLANT
DECANTER SPIKE VIAL GLASS SM (MISCELLANEOUS) IMPLANT
DRAPE C-ARM 42X72 X-RAY (DRAPES) ×3 IMPLANT
DRAPE C-ARMOR (DRAPES) ×3 IMPLANT
DRAPE EXTREMITY T 121X128X90 (DRAPE) ×3 IMPLANT
DRAPE IMP U-DRAPE 54X76 (DRAPES) ×3 IMPLANT
DRAPE SURG 17X23 STRL (DRAPES) ×6 IMPLANT
DRILL 2.6X122MM WL AO SHAFT (BIT) ×3 IMPLANT
DRSG PAD ABDOMINAL 8X10 ST (GAUZE/BANDAGES/DRESSINGS) ×6 IMPLANT
DURAPREP 26ML APPLICATOR (WOUND CARE) ×6 IMPLANT
ELECT REM PT RETURN 9FT ADLT (ELECTROSURGICAL) ×3
ELECTRODE REM PT RTRN 9FT ADLT (ELECTROSURGICAL) ×1 IMPLANT
GAUZE SPONGE 4X4 12PLY STRL (GAUZE/BANDAGES/DRESSINGS) ×3 IMPLANT
GAUZE XEROFORM 1X8 LF (GAUZE/BANDAGES/DRESSINGS) ×3 IMPLANT
GLOVE BIOGEL PI IND STRL 8 (GLOVE) ×1 IMPLANT
GLOVE BIOGEL PI INDICATOR 8 (GLOVE) ×2
GLOVE EXAM NITRILE MD LF STRL (GLOVE) ×3 IMPLANT
GLOVE SKINSENSE NS SZ7.5 (GLOVE) ×2
GLOVE SKINSENSE STRL SZ7.5 (GLOVE) ×1 IMPLANT
GLOVE SURG SS PI 8.0 STRL IVOR (GLOVE) ×3 IMPLANT
GLOVE SURG SYN 7.5  E (GLOVE) ×2
GLOVE SURG SYN 7.5 E (GLOVE) ×1 IMPLANT
GOWN STRL REIN XL XLG (GOWN DISPOSABLE) ×3 IMPLANT
GOWN STRL REUS W/ TWL LRG LVL3 (GOWN DISPOSABLE) ×1 IMPLANT
GOWN STRL REUS W/TWL LRG LVL3 (GOWN DISPOSABLE) ×2
K-WIRE ORTHOPEDIC 1.4X150L (WIRE) ×6
KWIRE ORTHOPEDIC 1.4X150L (WIRE) ×2 IMPLANT
NEEDLE HYPO 22GX1.5 SAFETY (NEEDLE) IMPLANT
NS IRRIG 1000ML POUR BTL (IV SOLUTION) ×3 IMPLANT
PACK BASIN DAY SURGERY FS (CUSTOM PROCEDURE TRAY) ×3 IMPLANT
PAD CAST 3X4 CTTN HI CHSV (CAST SUPPLIES) IMPLANT
PAD CAST 4YDX4 CTTN HI CHSV (CAST SUPPLIES) ×1 IMPLANT
PADDING CAST COTTON 3X4 STRL (CAST SUPPLIES)
PADDING CAST COTTON 4X4 STRL (CAST SUPPLIES) ×2
PADDING CAST COTTON 6X4 STRL (CAST SUPPLIES) ×3 IMPLANT
PADDING CAST SYN 6 (CAST SUPPLIES)
PADDING CAST SYNTHETIC 4 (CAST SUPPLIES)
PADDING CAST SYNTHETIC 4X4 STR (CAST SUPPLIES) IMPLANT
PADDING CAST SYNTHETIC 6X4 NS (CAST SUPPLIES) IMPLANT
PENCIL BUTTON HOLSTER BLD 10FT (ELECTRODE) ×3 IMPLANT
PLATE FIBULA 4H (Plate) ×3 IMPLANT
SCREW BONE 3.5X20MM (Screw) ×3 IMPLANT
SCREW BONE NON-LCKING 3.5X12MM (Screw) ×9 IMPLANT
SCREW LOCKING 3.5X16MM (Screw) ×3 IMPLANT
SCREW LOCKING 3.5X18MM (Screw) ×6 IMPLANT
SCREW NONLOCK 22MM (Screw) ×3 IMPLANT
SHEET MEDIUM DRAPE 40X70 STRL (DRAPES) ×3 IMPLANT
SLEEVE SCD COMPRESS KNEE MED (MISCELLANEOUS) ×3 IMPLANT
SPLINT FIBERGLASS 3X35 (CAST SUPPLIES) IMPLANT
SPLINT FIBERGLASS 4X30 (CAST SUPPLIES) IMPLANT
SPONGE LAP 18X18 X RAY DECT (DISPOSABLE) ×3 IMPLANT
SUCTION FRAZIER HANDLE 10FR (MISCELLANEOUS) ×2
SUCTION TUBE FRAZIER 10FR DISP (MISCELLANEOUS) ×1 IMPLANT
SUT ETHILON 3 0 PS 1 (SUTURE) ×3 IMPLANT
SUT VIC AB 0 CT1 27 (SUTURE) ×2
SUT VIC AB 0 CT1 27XBRD ANBCTR (SUTURE) ×1 IMPLANT
SUT VIC AB 2-0 CT1 27 (SUTURE) ×2
SUT VIC AB 2-0 CT1 TAPERPNT 27 (SUTURE) ×1 IMPLANT
SYR BULB 3OZ (MISCELLANEOUS) ×3 IMPLANT
SYR CONTROL 10ML LL (SYRINGE) IMPLANT
TOWEL OR 17X24 6PK STRL BLUE (TOWEL DISPOSABLE) ×3 IMPLANT
TUBE CONNECTING 20'X1/4 (TUBING) ×1
TUBE CONNECTING 20X1/4 (TUBING) ×2 IMPLANT
UNDERPAD 30X30 (UNDERPADS AND DIAPERS) ×3 IMPLANT
YANKAUER SUCT BULB TIP NO VENT (SUCTIONS) IMPLANT

## 2016-02-04 NOTE — Anesthesia Preprocedure Evaluation (Signed)
Anesthesia Evaluation  Patient identified by MRN, date of birth, ID band Patient awake    Reviewed: Allergy & Precautions, NPO status , Patient's Chart, lab work & pertinent test results  Airway Mallampati: I  TM Distance: >3 FB Neck ROM: Full    Dental  (+) Teeth Intact, Dental Advisory Given   Pulmonary former smoker,    breath sounds clear to auscultation       Cardiovascular hypertension, Pt. on medications  Rhythm:Regular Rate:Normal     Neuro/Psych    GI/Hepatic   Endo/Other  diabetes, Well Controlled, Type 2, Oral Hypoglycemic Agents  Renal/GU      Musculoskeletal   Abdominal   Peds  Hematology   Anesthesia Other Findings   Reproductive/Obstetrics                             Anesthesia Physical Anesthesia Plan  ASA: III  Anesthesia Plan: General   Post-op Pain Management: GA combined w/ Regional for post-op pain   Induction: Intravenous  Airway Management Planned: LMA  Additional Equipment:   Intra-op Plan:   Post-operative Plan: Extubation in OR  Informed Consent: I have reviewed the patients History and Physical, chart, labs and discussed the procedure including the risks, benefits and alternatives for the proposed anesthesia with the patient or authorized representative who has indicated his/her understanding and acceptance.   Dental advisory given  Plan Discussed with: CRNA, Anesthesiologist and Surgeon  Anesthesia Plan Comments:         Anesthesia Quick Evaluation

## 2016-02-04 NOTE — Transfer of Care (Signed)
Immediate Anesthesia Transfer of Care Note  Patient: Andre ChimeraMichael W Berrios  Procedure(s) Performed: Procedure(s): OPEN REDUCTION INTERNAL FIXATION (ORIF) LEFT BIMALLEOLAR ANKLE FRACTURE (Left)  Patient Location: PACU  Anesthesia Type:GA combined with regional for post-op pain  Level of Consciousness: sedated  Airway & Oxygen Therapy: Patient Spontanous Breathing and Patient connected to face mask oxygen  Post-op Assessment: Report given to RN and Post -op Vital signs reviewed and stable  Post vital signs: Reviewed and stable  Last Vitals:  Filed Vitals:   02/04/16 0955 02/04/16 1000  BP: 119/87 137/81  Pulse: 88 73  Temp:    Resp: 17 17    Last Pain:  Filed Vitals:   02/04/16 1001  PainSc: 2          Complications: No apparent anesthesia complications

## 2016-02-04 NOTE — Op Note (Addendum)
   Date of Surgery: 02/04/2016  INDICATIONS: Andre Watson is a 45 y.o.-year-old male who sustained a left ankle fracture; he was indicated for open reduction and internal fixation due to the displaced nature of the articular fracture and came to the operating room today for this procedure. The patient did consent to the procedure after discussion of the risks and benefits.  PREOPERATIVE DIAGNOSIS: left bimalleolar ankle fracture  POSTOPERATIVE DIAGNOSIS: Same.  PROCEDURE: Open treatment of left ankle fracture with internal fixation. Bimalleolar CPT [phone];   SURGEON: N. Glee ArvinMichael Chao Blazejewski, M.D.  ASSIST: none.  ANESTHESIA:  general, regional  TOURNIQUET TIME: see record  IV FLUIDS AND URINE: See anesthesia.  ESTIMATED BLOOD LOSS: minimal mL.  IMPLANTS: [employer]ker Variax 4 hole distal fibula plate  COMPLICATIONS: None.  DESCRIPTION OF PROCEDURE: The patient was brought to the operating room and placed supine on the operating table.  The patient had been signed prior to the procedure and this was documented. The patient had the anesthesia placed by the anesthesiologist.  A nonsterile tourniquet was placed on the upper thigh.  The prep verification and incision time-outs were performed to confirm that this was the correct patient, site, side and location. The patient had an SCD on the opposite lower extremity. The patient did receive antibiotics prior to the incision and was re-dosed during the procedure as needed at indicated intervals.  The patient had the lower extremity prepped and draped in the standard surgical fashion.  The extremity was exsanguinated using an esmarch bandage and the tourniquet was inflated to 300 mm Hg.  A lateral incision was created over distal fibula.  Full thickness flaps were elevated.  Fracture was exposed.  Organized hematoma was removed.  Fracture was reduced and held with a clamp.  An anterior to posterior lag screw was placed.  Fluoroscopy confirmed proper placement.  Clamp  was removed. Fracture stayed reduced.  An appropriately sized plate was placed on the lateral aspect of the fibula in neutralization mode.  Locking and unicortical locking screws were placed in the bone through the plate.  The posterior malleolus fracture was stable after fixation of the fibula.  Stress exam showed an intact syndesmosis.  Final xrays were taken.  Wound was thoroughly irrigated and closed with 0 vicryl, 2.0 vicryl, 3.0 nylon.  Sterile dressings were applied.  Short leg splint placed.  Patient tolerated procedure well.  POSTOPERATIVE PLAN: Andre Watson will remain nonweightbearing on this leg for approximately 6 weeks; Andre Watson will return for suture removal in 2 weeks.  He will be immobilized in a short leg splint and then transitioned to a CAM walker at his first follow up appointment.  Andre Watson will receive DVT prophylaxis based on other medications, activity level, and risk ratio of bleeding to thrombosis.  Mayra ReelN. Rashawd Milbern Doescher, MD [facility] [phone] 11:33 AM

## 2016-02-04 NOTE — Progress Notes (Signed)
Assisted Dr. Crews with left, ultrasound guided, popliteal/saphenous block. Side rails up, monitors on throughout procedure. See vital signs in flow sheet. Tolerated Procedure well. 

## 2016-02-04 NOTE — Anesthesia Procedure Notes (Addendum)
Anesthesia Regional Block:  Popliteal block  Pre-Anesthetic Checklist: ,, timeout performed, Correct Patient, Correct Site, Correct Laterality, Correct Procedure, Correct Position, site marked, Risks and benefits discussed,  Surgical consent,  Pre-op evaluation,  At surgeon's request and post-op pain management  Laterality: Left and Lower  Prep: chloraprep       Needles:  Injection technique: Single-shot  Needle Type: Echogenic Needle     Needle Length: 9cm 9 cm Needle Gauge: 21 and 21 G    Additional Needles:  Procedures: ultrasound guided (picture in chart) Popliteal block Narrative:  Start time: 02/04/2016 9:41 AM End time: 02/04/2016 9:45 AM Injection made incrementally with aspirations every 5 mL.  Performed by: Personally  Anesthesiologist: CREWS, DAVID   Anesthesia Regional Block:  Adductor canal block  Pre-Anesthetic Checklist: ,, timeout performed, Correct Patient, Correct Site, Correct Laterality, Correct Procedure, Correct Position, site marked, Risks and benefits discussed,  Surgical consent,  Pre-op evaluation,  At surgeon's request and post-op pain management  Laterality: Left and Lower  Prep: chloraprep       Needles:  Injection technique: Single-shot  Needle Type: Echogenic Needle     Needle Length: 9cm 9 cm Needle Gauge: 21 and 21 G    Additional Needles:  Procedures: ultrasound guided (picture in chart) Adductor canal block Narrative:  Start time: 02/04/2016 9:51 AM End time: 02/04/2016 9:55 AM Injection made incrementally with aspirations every 5 mL.  Performed by: Personally  Anesthesiologist: CREWS, DAVID   Procedure Name: LMA Insertion Date/Time: 02/04/2016 10:13 AM Performed by: Chandler DesanctisLINKA, Boneta Standre L Pre-anesthesia Checklist: Patient identified, Emergency Drugs available, Suction available and Patient being monitored Patient Re-evaluated:Patient Re-evaluated prior to inductionOxygen Delivery Method: Circle system  utilized Preoxygenation: Pre-oxygenation with 100% oxygen Intubation Type: IV induction Ventilation: Mask ventilation without difficulty LMA: LMA inserted LMA Size: 5.0 Number of attempts: 1 Airway Equipment and Method: Bite block Placement Confirmation: positive ETCO2 Tube secured with: Tape Dental Injury: Teeth and Oropharynx as per pre-operative assessment       Left Popliteal block image    Left saphenous block image

## 2016-02-04 NOTE — H&P (Signed)
    PREOPERATIVE H&P  Chief Complaint: left bimalleolar ankle fracture  HPI: Andre Watson is a 45 y.o. male who presents for surgical treatment of left bimalleolar ankle fracture.  He denies any changes in medical history.  Past Medical History  Diagnosis Date  . Hypertension   . Diabetes mellitus without complication (HCC)   . Hypercholesteremia   . Pneumonia   . Bimalleolar fracture of left ankle    Past Surgical History  Procedure Laterality Date  . Wisdom tooth extraction     Social History   Social History  . Marital Status: Single    Spouse Name: N/A  . Number of Children: N/A  . Years of Education: N/A   Social History Main Topics  . Smoking status: Former Games developermoker  . Smokeless tobacco: None  . Alcohol Use: Yes     Comment: social  . Drug Use: No  . Sexual Activity: Not Asked   Other Topics Concern  . None   Social History Narrative   History reviewed. No pertinent family history. No Known Allergies Prior to Admission medications   Medication Sig Start Date End Date Taking? Authorizing Provider  aspirin 81 MG tablet Take 81 mg by mouth daily.   Yes Historical Provider, MD  fenofibrate (TRICOR) 145 MG tablet Take 145 mg by mouth daily. 01/09/16  Yes Historical Provider, MD  JARDIANCE 10 MG TABS tablet Take 10 mg by mouth daily. 01/26/16  Yes Historical Provider, MD  losartan (COZAAR) 25 MG tablet Take 25 mg by mouth daily. 01/13/16  Yes Historical Provider, MD  metFORMIN (GLUCOPHAGE) 500 MG tablet Take 1,000 mg by mouth 2 (two) times daily. 01/09/16  Yes Historical Provider, MD  naproxen (NAPROSYN) 500 MG tablet Take 1 tablet (500 mg total) by mouth 2 (two) times daily as needed for mild pain or moderate pain (TAKE WITH MEALS.). 02/02/16  Yes Mercedes Camprubi-Soms, PA-C  oxyCODONE-acetaminophen (PERCOCET) 5-325 MG tablet Take 1-2 tablets by mouth every 6 (six) hours as needed for severe pain. 02/02/16  Yes Mercedes Camprubi-Soms, PA-C  simvastatin (ZOCOR) 40 MG  tablet Take 40 mg by mouth daily. 01/09/16  Yes Historical Provider, MD  ibuprofen (ADVIL,MOTRIN) 200 MG tablet Take 200 mg by mouth every 6 (six) hours as needed for fever.    Historical Provider, MD     Positive ROS: All other systems have been reviewed and were otherwise negative with the exception of those mentioned in the HPI and as above.  Physical Exam: General: Alert, no acute distress Cardiovascular: No pedal edema Respiratory: No cyanosis, no use of accessory musculature GI: abdomen soft Skin: No lesions in the area of chief complaint Neurologic: Sensation intact distally Psychiatric: Patient is competent for consent with normal mood and affect Lymphatic: no lymphedema  MUSCULOSKELETAL: exam stable  Assessment: left bimalleolar ankle fracture  Plan: Plan for Procedure(s): OPEN REDUCTION INTERNAL FIXATION (ORIF) LEFT BIMALLEOLAR ANKLE FRACTURE  The risks benefits and alternatives were discussed with the patient including but not limited to the risks of nonoperative treatment, versus surgical intervention including infection, bleeding, nerve injury,  blood clots, cardiopulmonary complications, morbidity, mortality, among others, and they were willing to proceed.   Cheral AlmasXu, Naiping Lauro, MD   02/04/2016 8:56 AM

## 2016-02-04 NOTE — Anesthesia Postprocedure Evaluation (Signed)
Anesthesia Post Note  Patient: Andre Watson  Procedure(s) Performed: Procedure(s) (LRB): OPEN REDUCTION INTERNAL FIXATION (ORIF) LEFT BIMALLEOLAR ANKLE FRACTURE (Left)  Patient location during evaluation: PACU Anesthesia Type: General Level of consciousness: awake and alert Pain management: pain level controlled Vital Signs Assessment: post-procedure vital signs reviewed and stable Respiratory status: spontaneous breathing, nonlabored ventilation and respiratory function stable Cardiovascular status: blood pressure returned to baseline and stable Postop Assessment: no signs of nausea or vomiting Anesthetic complications: no    Last Vitals:  Filed Vitals:   02/04/16 1245 02/04/16 1323  BP: 108/72 117/71  Pulse: 71 69  Temp:  36.7 C  Resp: 24 20    Last Pain:  Filed Vitals:   02/04/16 1325  PainSc: 3                  Cristianna Cyr A

## 2016-02-04 NOTE — Discharge Instructions (Signed)
° ° °  1. Keep splint clean and dry °2. Elevate foot above level of the heart °3. Take aspirin to prevent blood clots °4. Take pain meds as needed °5. Strict non weight bearing to operative extremity ° ° °Post Anesthesia Home Care Instructions ° °Activity: °Get plenty of rest for the remainder of the day. A responsible adult should stay with you for 24 hours following the procedure.  °For the next 24 hours, DO NOT: °-Drive a car °-Operate machinery °-Drink alcoholic beverages °-Take any medication unless instructed by your physician °-Make any legal decisions or sign important papers. ° °Meals: °Start with liquid foods such as gelatin or soup. Progress to regular foods as tolerated. Avoid greasy, spicy, heavy foods. If nausea and/or vomiting occur, drink only clear liquids until the nausea and/or vomiting subsides. Call your physician if vomiting continues. ° °Special Instructions/Symptoms: °Your throat may feel dry or sore from the anesthesia or the breathing tube placed in your throat during surgery. If this causes discomfort, gargle with warm salt water. The discomfort should disappear within 24 hours. ° °If you had a scopolamine patch placed behind your ear for the management of post- operative nausea and/or vomiting: ° °1. The medication in the patch is effective for 72 hours, after which it should be removed.  Wrap patch in a tissue and discard in the trash. Wash hands thoroughly with soap and water. °2. You may remove the patch earlier than 72 hours if you experience unpleasant side effects which may include dry mouth, dizziness or visual disturbances. °3. Avoid touching the patch. Wash your hands with soap and water after contact with the patch. °  °Regional Anesthesia Blocks ° °1. Numbness or the inability to move the "blocked" extremity may last from 3-48 hours after placement. The length of time depends on the medication injected and your individual response to the medication. If the numbness is not going  away after 48 hours, call your surgeon. ° °2. The extremity that is blocked will need to be protected until the numbness is gone and the  Strength has returned. Because you cannot feel it, you will need to take extra care to avoid injury. Because it may be weak, you may have difficulty moving it or using it. You may not know what position it is in without looking at it while the block is in effect. ° °3. For blocks in the legs and feet, returning to weight bearing and walking needs to be done carefully. You will need to wait until the numbness is entirely gone and the strength has returned. You should be able to move your leg and foot normally before you try and bear weight or walk. You will need someone to be with you when you first try to ensure you do not fall and possibly risk injury. ° °4. Bruising and tenderness at the needle site are common side effects and will resolve in a few days. ° °5. Persistent numbness or new problems with movement should be communicated to the surgeon or the [facility] ([phone])/  Surgery Center ([phone]). ° ° °

## 2016-02-06 ENCOUNTER — Encounter (HOSPITAL_BASED_OUTPATIENT_CLINIC_OR_DEPARTMENT_OTHER): Payer: Self-pay | Admitting: Orthopaedic Surgery

## 2017-02-21 IMAGING — CR DG ANKLE COMPLETE 3+V*L*
2 series · 2 of 2 positions shown · non-contrast
Comparison: None.

CLINICAL DATA: Left ankle deformity after slipping on a mat during
a training exercise with the [employer].

EXAM:
LEFT ANKLE COMPLETE - 3+ VIEW

[ankle ap]
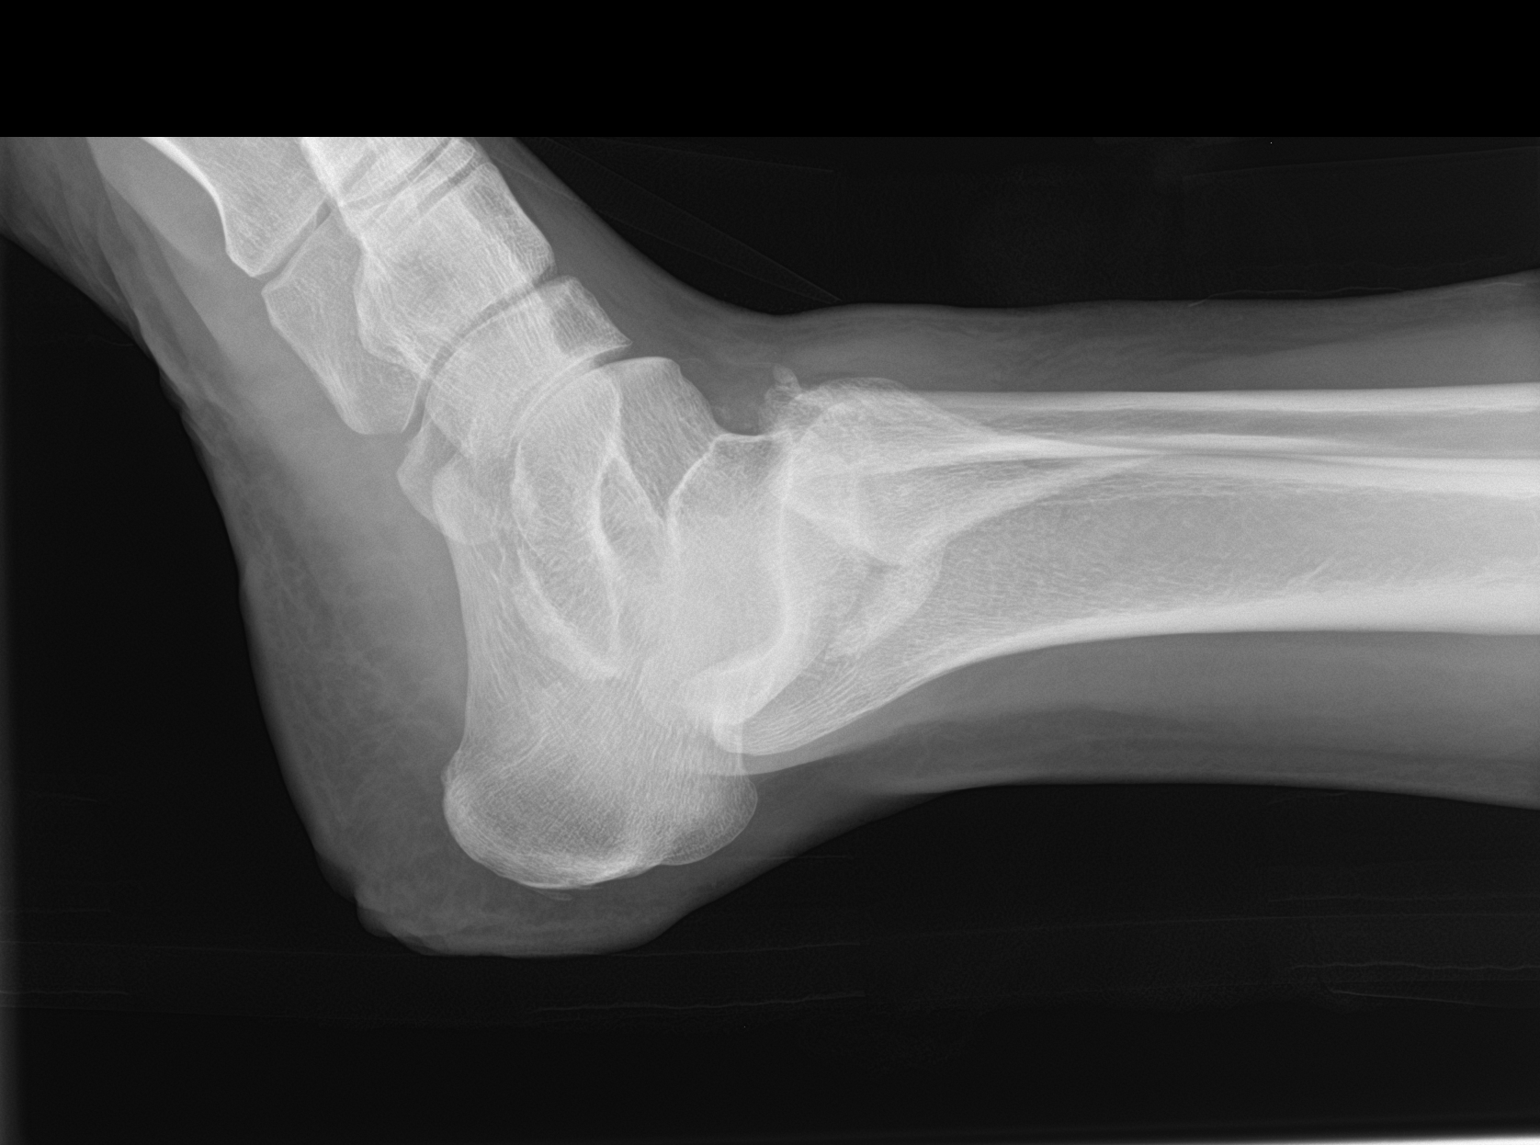

[ankle lat]
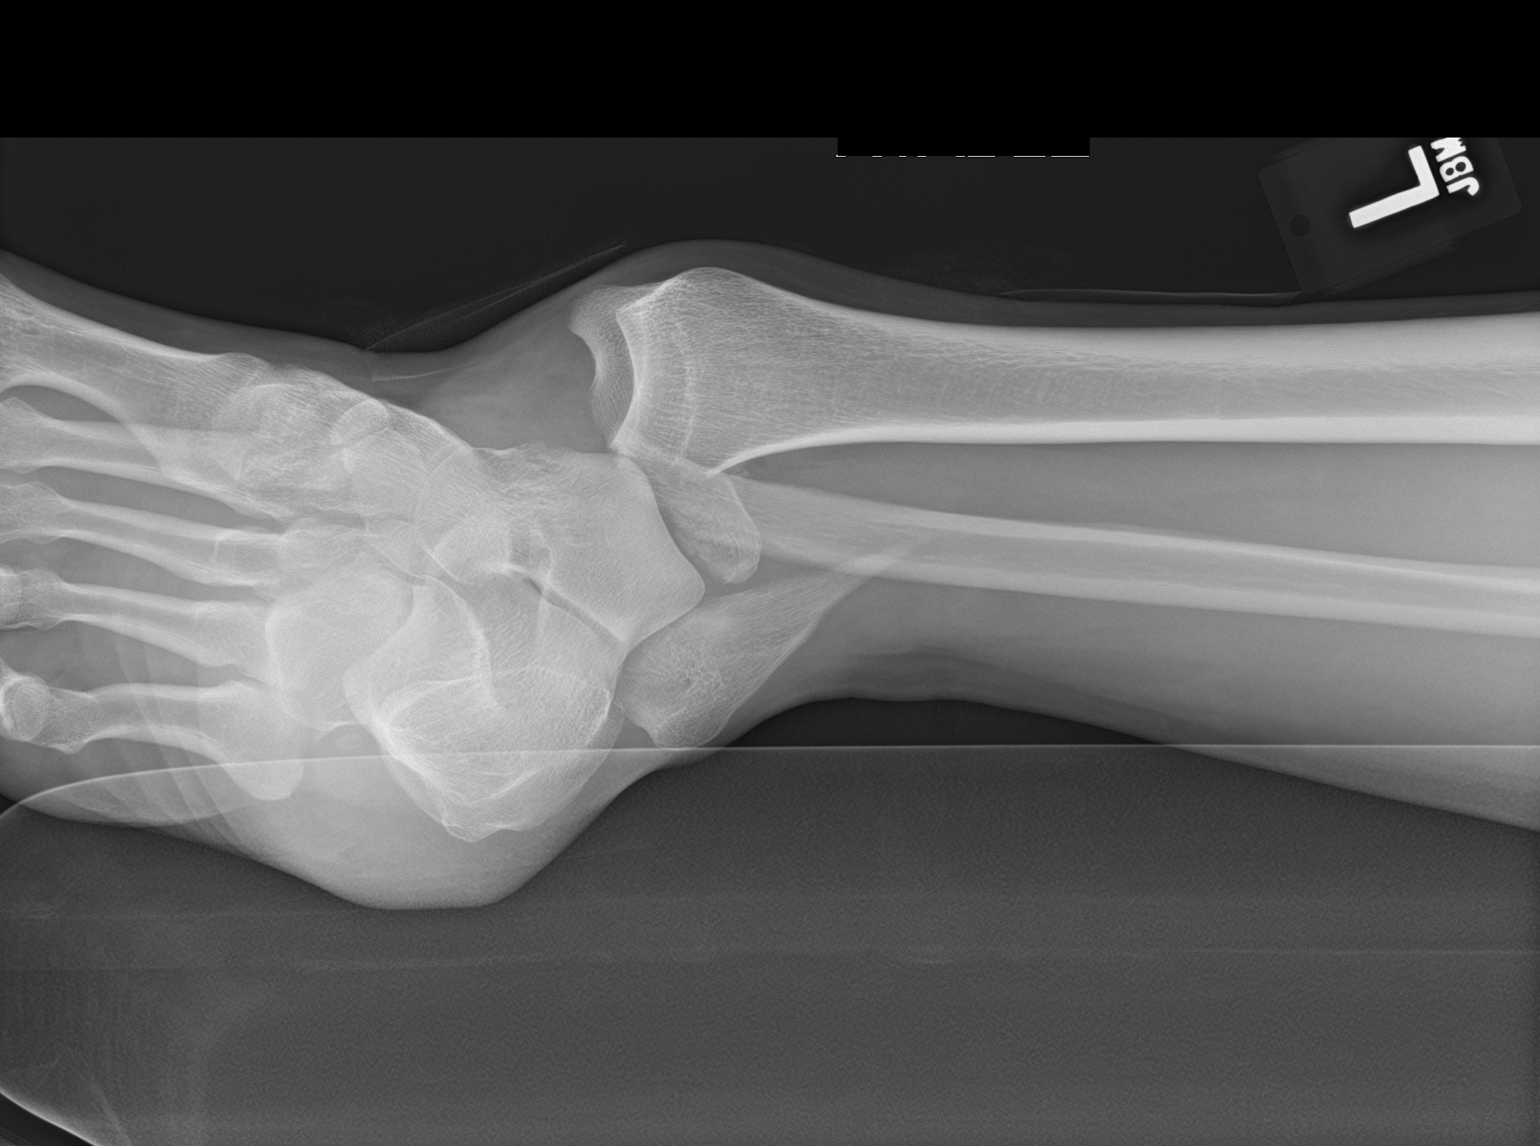

[2 of 2 positions shown; findings below may reference images not displayed]

FINDINGS: Prominently displaced oblique fracture of the lateral malleolus. The
distal tibia is displaced about 3.5 cm head medially, as is the
shaft of the distal fibula, and both appear rotated with respect to
the talus. There is a bony fragment just proximal to the talus
probably representing a posterior malleolar fragment. Classic
appearance of posterior malleolar fracture is difficult to ascertain
on the lateral projection due to the degree of rotation of the
tibia. The abnormal appearance on today' s exam implies a tear of
the tibiotalar portion of the deltoid ligament. Because of
orientation on the lateral projection, the subtalar joint and
calcaneocuboid joint are difficult to assess.
IMPRESSION: 1. Trimalleolar fracture dislocation, including oblique lateral
malleolar fracture, posterior malleolar fracture, and with prominent
medial dislocation of the distal tibia and the fibular shaft
fracture component with respect to the talus implying a concomitant
tear of the deltoid ligament. In addition there is a somewhat
unusual degree of rotation of the distal tibia and fibular shaft
with respect to the talus, with the medial malleolus rotated to a
posterior position with respect to the talus.
